# Patient Record
Sex: Female | Born: 1975 | Race: White | Hispanic: No | Marital: Married
Health system: Southern US, Community
[De-identification: ages and names within clinical notes are randomized; demographics above are authoritative.]

## PROBLEM LIST (undated history)

## (undated) DIAGNOSIS — E079 Disorder of thyroid, unspecified: Secondary | ICD-10-CM

## (undated) DIAGNOSIS — I10 Essential (primary) hypertension: Secondary | ICD-10-CM

## (undated) DIAGNOSIS — F419 Anxiety disorder, unspecified: Secondary | ICD-10-CM

## (undated) HISTORY — DX: Anxiety disorder, unspecified: F41.9

## (undated) HISTORY — DX: Essential (primary) hypertension: I10

## (undated) HISTORY — PX: TONSILLECTOMY: SUR1361

## (undated) HISTORY — PX: LEG SURGERY: SHX1003

## (undated) HISTORY — DX: Disorder of thyroid, unspecified: E07.9

---

## 2000-03-19 ENCOUNTER — Other Ambulatory Visit: Admission: RE | Admit: 2000-03-19 | Discharge: 2000-03-19 | Payer: Self-pay | Admitting: Obstetrics and Gynecology

## 2000-10-19 ENCOUNTER — Inpatient Hospital Stay (HOSPITAL_COMMUNITY): Admission: AD | Admit: 2000-10-19 | Discharge: 2000-10-22 | Payer: Self-pay | Admitting: Obstetrics and Gynecology

## 2001-03-18 ENCOUNTER — Other Ambulatory Visit: Admission: RE | Admit: 2001-03-18 | Discharge: 2001-03-18 | Payer: Self-pay | Admitting: Obstetrics & Gynecology

## 2002-05-18 ENCOUNTER — Other Ambulatory Visit: Admission: RE | Admit: 2002-05-18 | Discharge: 2002-05-18 | Payer: Self-pay | Admitting: Obstetrics & Gynecology

## 2003-05-22 ENCOUNTER — Other Ambulatory Visit: Admission: RE | Admit: 2003-05-22 | Discharge: 2003-05-22 | Payer: Self-pay | Admitting: Obstetrics & Gynecology

## 2004-05-22 ENCOUNTER — Other Ambulatory Visit: Admission: RE | Admit: 2004-05-22 | Discharge: 2004-05-22 | Payer: Self-pay | Admitting: Obstetrics & Gynecology

## 2006-04-16 ENCOUNTER — Inpatient Hospital Stay (HOSPITAL_COMMUNITY): Admission: RE | Admit: 2006-04-16 | Discharge: 2006-04-18 | Payer: Self-pay | Admitting: Obstetrics & Gynecology

## 2017-05-20 ENCOUNTER — Other Ambulatory Visit: Payer: Self-pay | Admitting: Obstetrics & Gynecology

## 2017-05-20 DIAGNOSIS — R928 Other abnormal and inconclusive findings on diagnostic imaging of breast: Secondary | ICD-10-CM

## 2017-05-27 ENCOUNTER — Ambulatory Visit: Payer: Self-pay

## 2017-05-27 ENCOUNTER — Ambulatory Visit
Admission: RE | Admit: 2017-05-27 | Discharge: 2017-05-27 | Disposition: A | Discharge status: Home / Self Care | Payer: BLUE CROSS/BLUE SHIELD | Source: Ambulatory Visit | Attending: Obstetrics & Gynecology | Admitting: Obstetrics & Gynecology

## 2017-05-27 DIAGNOSIS — R928 Other abnormal and inconclusive findings on diagnostic imaging of breast: Secondary | ICD-10-CM

## 2018-12-28 IMAGING — MG DIGITAL DIAGNOSTIC UNILATERAL LEFT MAMMOGRAM WITH TOMO AND CAD
4 series · 4 of 12 positions shown · non-contrast
Comparison: Previous exam(s).

CLINICAL DATA: 42-year-old patient recalled from recent 2D
screening mammogram for evaluation of a possible left breast
asymmetry seen superiorly in the MLO projection.

EXAM:
DIGITAL DIAGNOSTIC UNILATERAL LEFT MAMMOGRAM WITH CAD AND TOMO

[L MLO synth-2D]
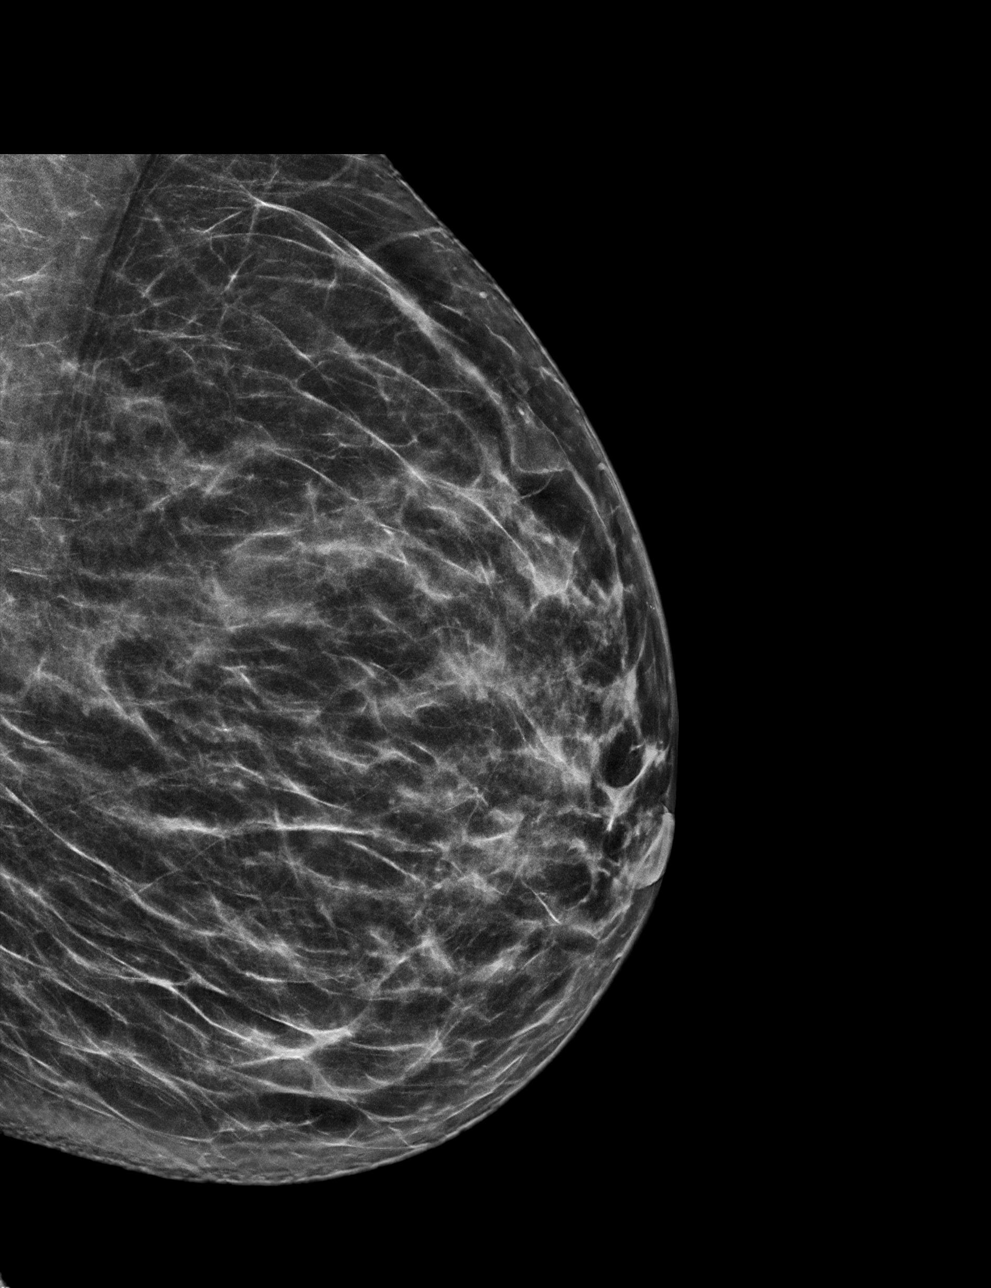

[L CC synth-2D]
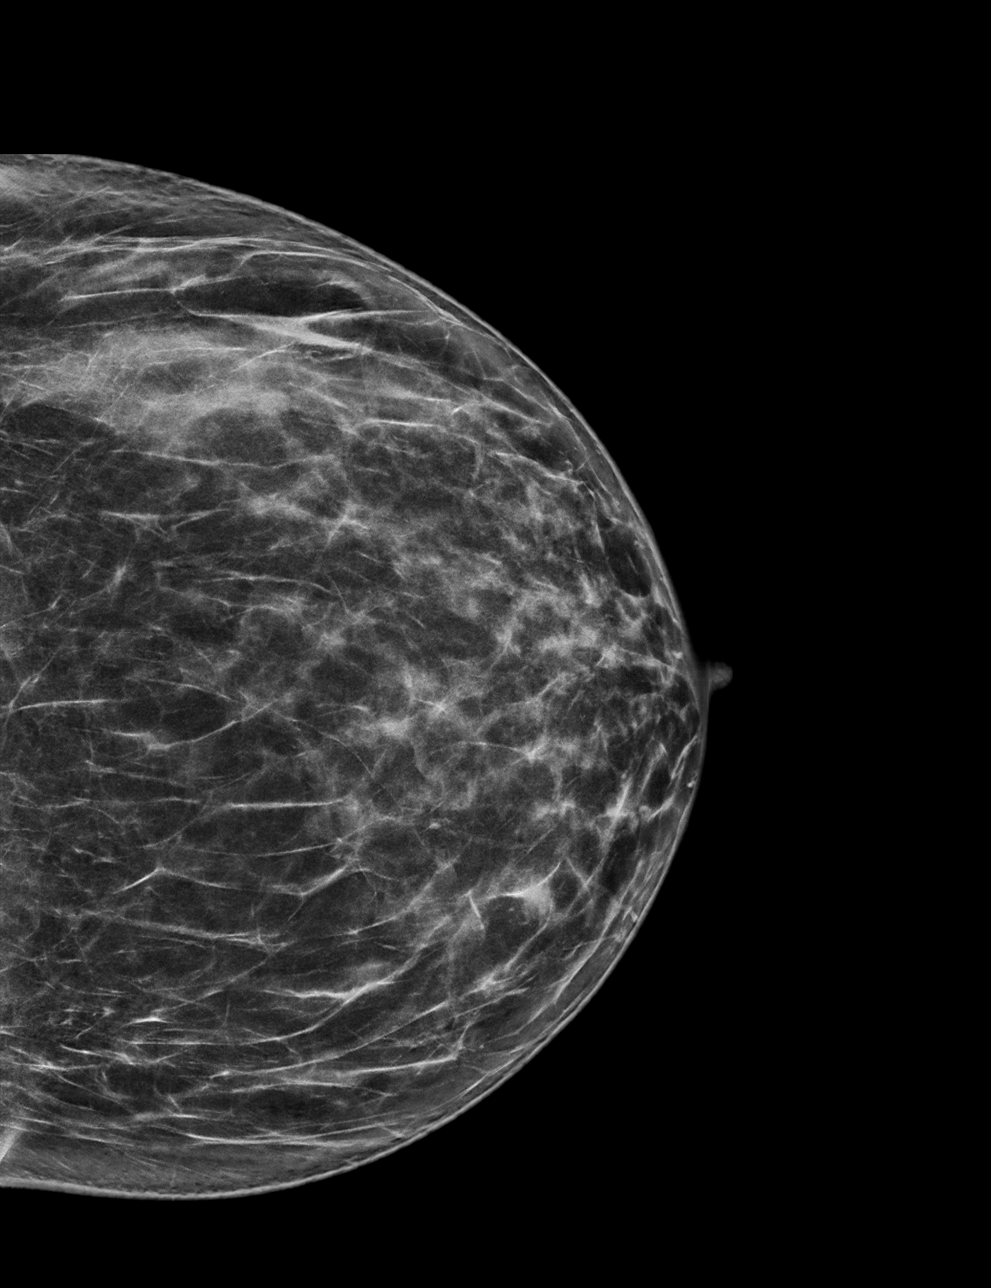

[L CC tomo · tomo slice 33/65.0]
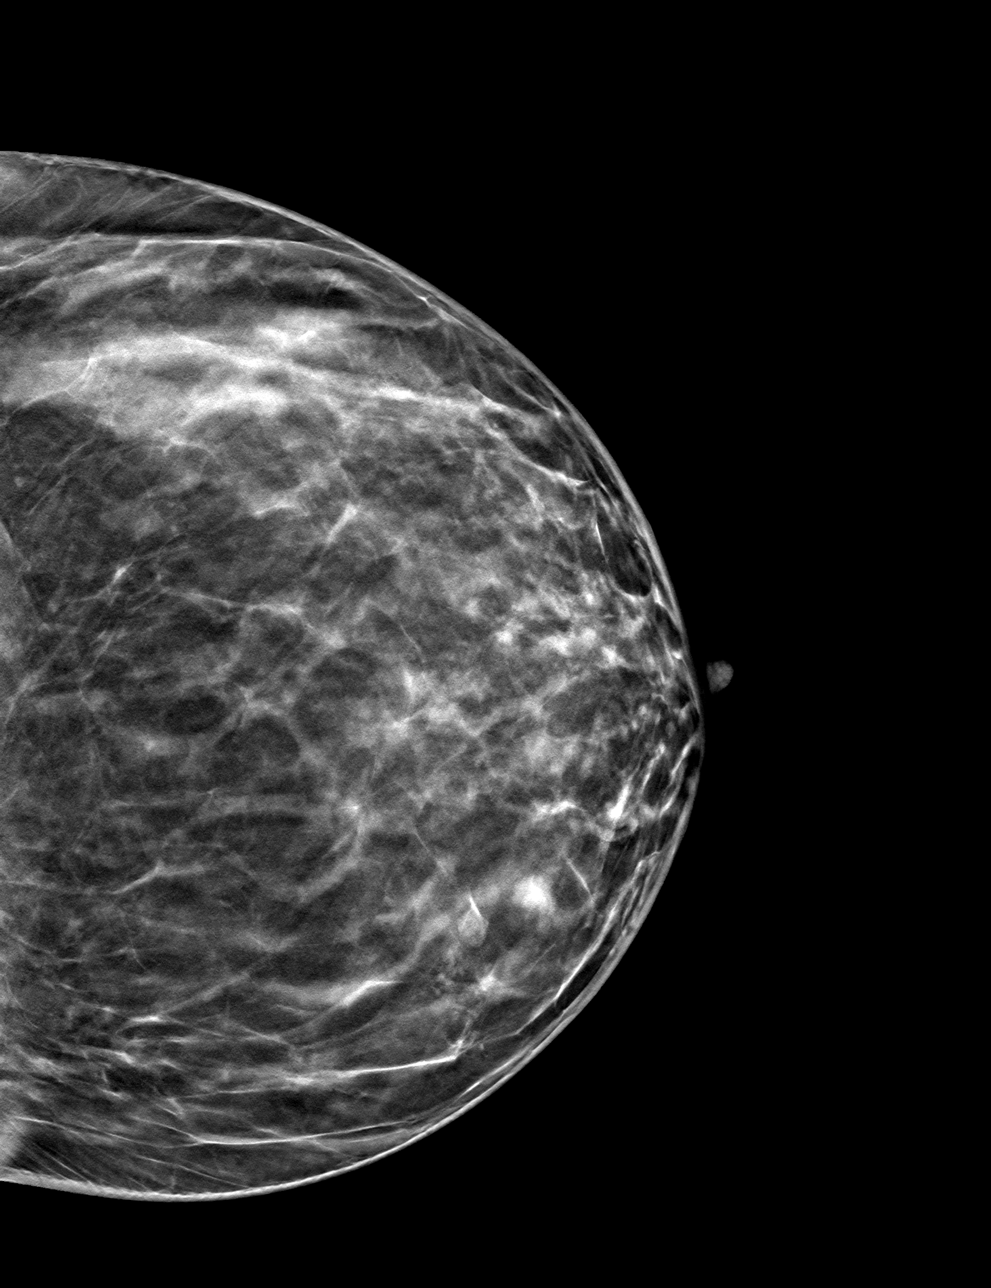

[L MLO tomo · tomo slice 30/59.0]
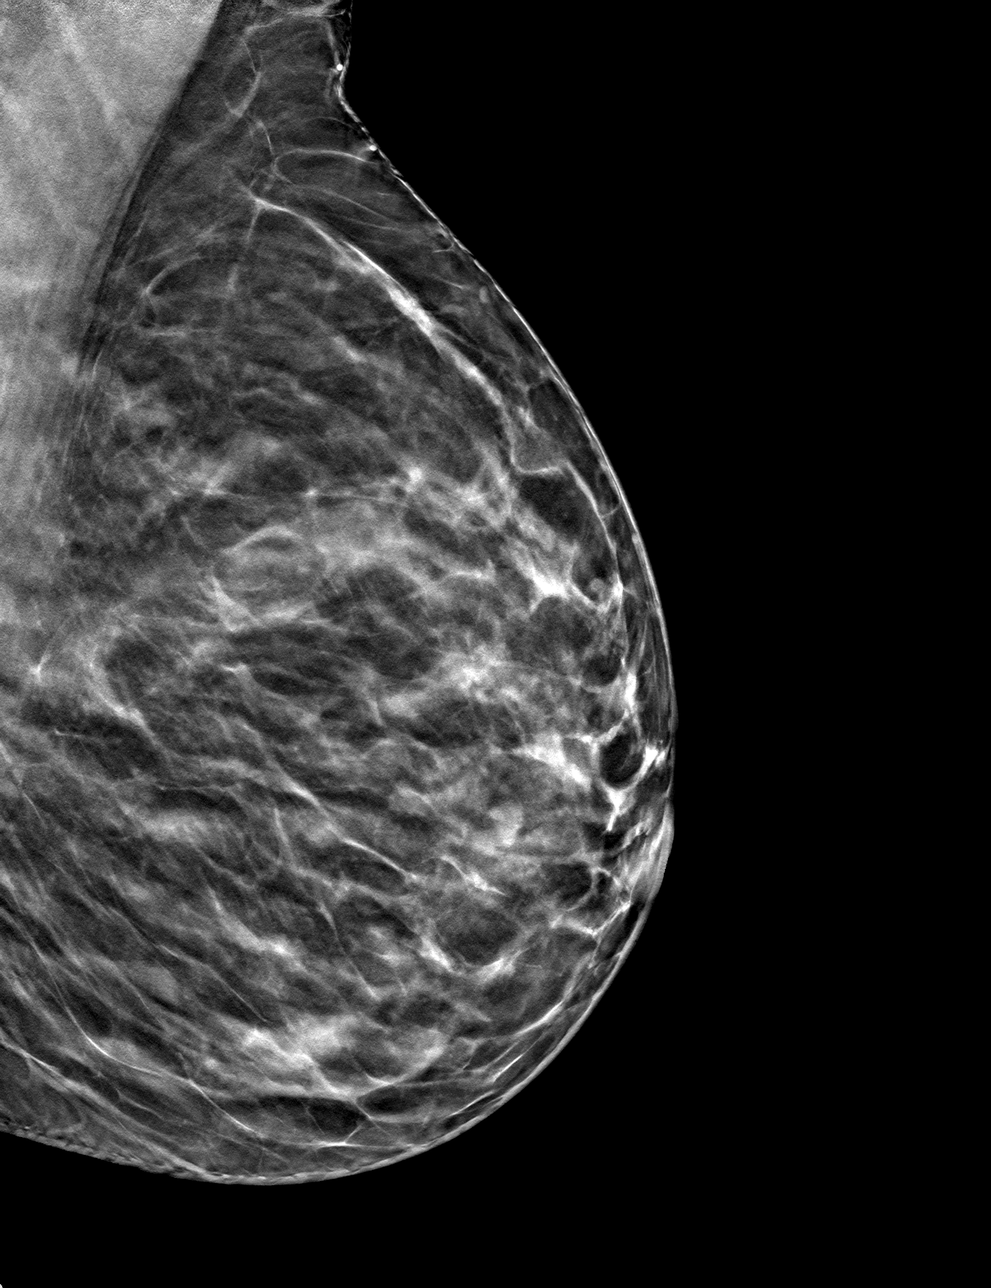

[4 of 12 positions shown; findings below may reference images not displayed]

ACR Breast Density Category c: The breast tissue is heterogeneously
dense, which may obscure small masses.
FINDINGS: Whole breast MLO and CC views including tomography show normal
fibroglandular tissue in the superior left breast. The possible
asymmetry identified on the screening mammogram is shown to be due
to overlap of fibroglandular densities. No mass or architectural
distortion is identified in the left breast.

Mammographic images were processed with CAD.
IMPRESSION: No evidence of malignancy in the left breast.

RECOMMENDATION:
Screening mammogram in one year.(Code:[id number])

I have discussed the findings and recommendations with the patient.
Results were also provided in writing at the conclusion of the
visit. If applicable, a reminder letter will be sent to the patient
regarding the next appointment.

BI-RADS CATEGORY  1: Negative.

## 2019-05-08 ENCOUNTER — Ambulatory Visit: Payer: Self-pay | Attending: Internal Medicine

## 2019-05-08 DIAGNOSIS — Z23 Encounter for immunization: Secondary | ICD-10-CM | POA: Insufficient documentation

## 2019-05-08 NOTE — Progress Notes (Signed)
   Covid-19 Vaccination Clinic  Name:  Maria Ewing    MRN: [id number] DOB: 1975-08-29  05/08/2019  Maria Ewing was observed post Covid-19 immunization for 15 minutes without incident. She was provided with Vaccine Information Sheet and instruction to access the V-Safe system.   Maria Ewing was instructed to call 911 with any severe reactions post vaccine: Marland Kitchen Difficulty breathing  . Swelling of face and throat  . A fast heartbeat  . A bad rash all over body  . Dizziness and weakness   Immunizations Administered    Name Date Dose VIS Date Route   Pfizer COVID-19 Vaccine 05/08/2019  5:17 PM 0.3 mL 02/11/2019 Intramuscular   Manufacturer: [employer], [employer]   Lot: [id number]   NDC: [address]-3

## 2019-06-07 ENCOUNTER — Ambulatory Visit: Payer: Self-pay | Attending: Family Medicine

## 2019-06-07 DIAGNOSIS — Z23 Encounter for immunization: Secondary | ICD-10-CM

## 2019-06-07 NOTE — Progress Notes (Signed)
   Covid-19 Vaccination Clinic  Name:  Maria Ewing    MRN: [id number] DOB: 14-Mar-1975  06/07/2019  Ms. Maria Ewing was observed post Covid-19 immunization for 15 minutes without incident. She was provided with Vaccine Information Sheet and instruction to access the V-Safe system.   Ms. Maria Ewing was instructed to call 911 with any severe reactions post vaccine: Marland Kitchen Difficulty breathing  . Swelling of face and throat  . A fast heartbeat  . A bad rash all over body  . Dizziness and weakness   Immunizations Administered    Name Date Dose VIS Date Route   Pfizer COVID-19 Vaccine 06/07/2019  2:47 PM 0.3 mL 02/11/2019 Intramuscular   Manufacturer: [employer], [employer]   Lot: [id number]   NDC: [address]-8

## 2019-11-29 ENCOUNTER — Ambulatory Visit: Payer: Self-pay | Admitting: Cardiology

## 2019-12-09 ENCOUNTER — Other Ambulatory Visit (HOSPITAL_COMMUNITY)
Admission: RE | Admit: 2019-12-09 | Discharge: 2019-12-09 | Disposition: A | Discharge status: Home / Self Care | Payer: BC Managed Care – PPO | Source: Ambulatory Visit | Attending: Cardiology | Admitting: Cardiology

## 2019-12-09 ENCOUNTER — Ambulatory Visit: Payer: BC Managed Care – PPO | Admitting: Cardiology

## 2019-12-09 ENCOUNTER — Encounter: Payer: Self-pay | Admitting: Cardiology

## 2019-12-09 ENCOUNTER — Other Ambulatory Visit: Payer: Self-pay

## 2019-12-09 VITALS — BP 148/89 | HR 87 | Resp 16 | Ht 68.0 in | Wt 163.0 lb

## 2019-12-09 DIAGNOSIS — I10 Essential (primary) hypertension: Secondary | ICD-10-CM

## 2019-12-09 DIAGNOSIS — Z01812 Encounter for preprocedural laboratory examination: Secondary | ICD-10-CM | POA: Diagnosis present

## 2019-12-09 DIAGNOSIS — Z20822 Contact with and (suspected) exposure to covid-19: Secondary | ICD-10-CM | POA: Insufficient documentation

## 2019-12-09 DIAGNOSIS — R072 Precordial pain: Secondary | ICD-10-CM

## 2019-12-09 DIAGNOSIS — I493 Ventricular premature depolarization: Secondary | ICD-10-CM

## 2019-12-09 LAB — SARS CORONAVIRUS 2 (TAT 6-24 HRS): SARS Coronavirus 2: NEGATIVE

## 2019-12-09 NOTE — Progress Notes (Signed)
Date:  12/09/2019   ID:  Maria Ewing, DOB 12-29-1975, MRN [id number]  PCP:  Chesley Noon, MD  Cardiologist:  Rex Kras, DO, [facility] (established care 12/09/2019) Former Cardiology Providers: Nolene Ebbs, MD  REASON FOR CONSULT: Chest Pain and Tachycardia   REQUESTING PHYSICIAN:  Chesley Noon, MD [place],  [place] [address]  Chief Complaint  Patient presents with  . Tachycardia  . New Patient (Initial Visit)  . Chest Pain    HPI  Maria Ewing is a 44 y.o. female who presents to the office with a chief complaint of " chest pain." Patient's past medical history and cardiovascular risk factors include: Hypertension, hypothyroidism.  She is referred to the office at the request of Chesley Noon, MD for evaluation of chest pain and tachycardia..  Chest pain: Patient states that she been having symptoms of chest discomfort since September 2020.  She has already seen a cardiologist at [facility] who recommended an echocardiogram and improvement of her modifiable cardiovascular risk factors.  Due to continued symptoms she presents for reevaluation.  Patient states that the intensity and frequency of chest discomfort remains the same since September 2020.  The pain is located over the left anterior chest wall, it lasts for about 10 minutes.  At times it is brought on by effort related activities and can resolve with rest as well.  She has not used sublingual nitroglycerin tablets for her discomfort.  She is overall euvolemic and not in congestive heart failure.  She had an echocardiogram done at an outside facility I do not have the images to review but the report was reviewed findings noted below.  Patient has not had a stress test or any additional cardiovascular testing since then.  In regards to tachycardia patient states that her symptoms are overall stable.  Her heart rate is well controlled at today's office visit.  Patient's blood  pressures are now well controlled at today's visit.  She is contributing this to underlying stress and not taking her morning medications.  I have asked her to keep a log of her blood pressures and to bring it in at the next visit.  No family history of premature coronary disease or sudden cardiac death.  FUNCTIONAL STATUS: Camping activities twice a week and walks about 3 miles a day at work.    ALLERGIES: Allergies  Allergen Reactions  . Oxycodone Other (See Comments)    " I lose time."  . Amoxicillin Rash    MEDICATION LIST PRIOR TO VISIT: Current Meds  Medication Sig  . albuterol (PROVENTIL) (2.5 MG/3ML) 0.083% nebulizer solution as needed.  Marland Kitchen albuterol (VENTOLIN HFA) 108 (90 Base) MCG/ACT inhaler Inhale into the lungs as needed.  Marland Kitchen amLODipine (NORVASC) 5 MG tablet Take 5 mg by mouth daily.  . chlorthalidone (HYGROTON) 25 MG tablet Take 12.5 mg by mouth daily.  . ferrous sulfate 325 (65 FE) MG tablet Take 1 tablet by mouth daily.  . fexofenadine (ALLEGRA) 60 MG tablet Take 1 tablet by mouth as needed.  . hydrOXYzine (ATARAX/VISTARIL) 25 MG tablet Take by mouth as needed.  Marland Kitchen levothyroxine (SYNTHROID) 100 MCG tablet Take 1 tablet by mouth daily.     PAST MEDICAL HISTORY: Past Medical History:  Diagnosis Date  . Anxiety   . Hypertension   . Thyroid disease     PAST SURGICAL HISTORY: Past Surgical History:  Procedure Laterality Date  . CESAREAN SECTION    . LEG SURGERY    .  TONSILLECTOMY      FAMILY HISTORY: The patient family history includes Breast cancer in her maternal grandmother; COPD in her father; Hypertension in her mother.  SOCIAL HISTORY:  The patient  reports that she is a non-smoker but has been exposed to tobacco smoke. She has never used smokeless tobacco. She reports previous alcohol use. She reports that she does not use drugs.  REVIEW OF SYSTEMS: Review of Systems  Constitutional: Negative for chills and fever.  HENT: Negative for hoarse voice  and nosebleeds.   Eyes: Negative for discharge, double vision and pain.  Cardiovascular: Positive for chest pain. Negative for claudication, dyspnea on exertion, leg swelling, near-syncope, orthopnea, palpitations, paroxysmal nocturnal dyspnea and syncope.  Respiratory: Negative for hemoptysis and shortness of breath.   Musculoskeletal: Negative for muscle cramps and myalgias.  Gastrointestinal: Negative for abdominal pain, constipation, diarrhea, hematemesis, hematochezia, melena, nausea and vomiting.  Neurological: Negative for dizziness and light-headedness.    PHYSICAL EXAM: Vitals with BMI 12/09/2019  Height '5\' 8"'   Weight 163 lbs  BMI 62.70  Systolic 350  Diastolic 89  Pulse 87   CONSTITUTIONAL: Well-developed and well-nourished. No acute distress.  SKIN: Skin is warm and dry. No rash noted. No cyanosis. No pallor. No jaundice HEAD: Normocephalic and atraumatic.  EYES: No scleral icterus MOUTH/THROAT: Moist oral membranes.  NECK: No JVD present. No thyromegaly noted. No carotid bruits  LYMPHATIC: No visible cervical adenopathy.  CHEST Normal respiratory effort. No intercostal retractions  LUNGS: Clear to auscultation bilaterally.  No stridor. No wheezes. No rales.  CARDIOVASCULAR: Regular rate and rhythm, positive S1-S2, no murmurs rubs or gallops appreciated. ABDOMINAL: No apparent ascites.  EXTREMITIES: No peripheral edema  HEMATOLOGIC: No significant bruising NEUROLOGIC: Oriented to person, place, and time. Nonfocal. Normal muscle tone.  PSYCHIATRIC: Normal mood and affect. Normal behavior. Cooperative  CARDIAC DATABASE: EKG: 12/09/2019: Normal sinus rhythm, 88 bpm, normal axis, occasional PVCs, without underlying injury pattern.  Echocardiogram: 02/2019 at [facility]: Left Ventricle Normal left ventricular structure and size. Wall thickness is normal. Systolic function is normal with an ejection fraction of 55-65%. Wall motion is within normal limits. There  is no diastolic dysfunction and normal left atrial pressure.   Stress Testing: No results found for this or any previous visit from the past 1095 days.  Heart Catheterization: None  LABORATORY DATA: External Labs: Collected:  11/14/2019 Care Everywhere Hb: 11.33m/dL Creatinine 0.83 mg/dL. eGFR: 85 mL/min per 1.73 m Lipid profile: Total cholesterol 189, triglycerides 125, HDL 37, LDL 129 TSH: 2.22  IMPRESSION:    ICD-10-CM   1. Precordial chest pain  R07.2 EKG 12-Lead    CT CARDIAC SCORING    PCV CARDIAC STRESS TEST    Pregnancy, urine    SARS-COV-2 RNA,(COVID-19) QUAL NAAT  2. Essential hypertension, benign  I10      RECOMMENDATIONS: Maria Ewing a 44y.o. female whose past medical history and cardiac risk factors include: hypertension, hypothyroidism.   Precordial chest pain: EKG shows normal sinus rhythm without underlying injury pattern. Her symptoms of chest discomfort have both typical and atypical features.  Because they have been longstanding we discussed additional cardiovascular testing for further evaluation and management.  We discussed undergoing either coronary CTA or exercise treadmill stress test with coronary calcification scoring.  Patient would like to proceed with an exercise treadmill stress test and calcium scoring for further risk stratification. Continue current medications. Patient is asked to keep a log of her blood pressures and to bring it  in at the next visit. She is asked to seek medical attention if her symptoms increase in intensity, frequency, duration or has typical chest pain.  She verbalizes understanding and provides verbal feedback. Patient will need to get Covid screening prior to the GXT.  Patient will also need a urine pregnancy screen prior to CT. Echocardiogram results reviewed from care everywhere and noted above for further reference Independently reviewed labs performed at [facility] as well. Estimated 10-year risk of ASCVD  approximately 1.8%.  Benign essential hypertension: Continue current medications.  Currently managed by primary care provider.  Patient had occasional PVCs on the EKG.  We will continue to monitor for now.   FINAL MEDICATION LIST END OF ENCOUNTER: No orders of the defined types were placed in this encounter.   There are no discontinued medications.   Current Outpatient Medications:  .  albuterol (PROVENTIL) (2.5 MG/3ML) 0.083% nebulizer solution, as needed., Disp: , Rfl:  .  albuterol (VENTOLIN HFA) 108 (90 Base) MCG/ACT inhaler, Inhale into the lungs as needed., Disp: , Rfl:  .  amLODipine (NORVASC) 5 MG tablet, Take 5 mg by mouth daily., Disp: , Rfl:  .  chlorthalidone (HYGROTON) 25 MG tablet, Take 12.5 mg by mouth daily., Disp: , Rfl:  .  ferrous sulfate 325 (65 FE) MG tablet, Take 1 tablet by mouth daily., Disp: , Rfl:  .  fexofenadine (ALLEGRA) 60 MG tablet, Take 1 tablet by mouth as needed., Disp: , Rfl:  .  hydrOXYzine (ATARAX/VISTARIL) 25 MG tablet, Take by mouth as needed., Disp: , Rfl:  .  levothyroxine (SYNTHROID) 100 MCG tablet, Take 1 tablet by mouth daily., Disp: , Rfl:   Orders Placed This Encounter  Procedures  . SARS-COV-2 RNA,(COVID-19) QUAL NAAT  . CT CARDIAC SCORING  . Pregnancy, urine  . PCV CARDIAC STRESS TEST  . EKG 12-Lead    There are no Patient Instructions on file for this visit.   --Continue cardiac medications as reconciled in final medication list. --Return in about 4 weeks (around 01/06/2020) for Reevaluation of chest paina and review test results. . Or sooner if needed. --Continue follow-up with your primary care physician regarding the management of your other chronic comorbid conditions.  Patient's questions and concerns were addressed to her satisfaction. She voices understanding of the instructions provided during this encounter.   This note was created using a voice recognition software as a result there may be grammatical errors  inadvertently enclosed that do not reflect the nature of this encounter. Every attempt is made to correct such errors.  Rex Kras, [place], [facility]  Pager: [phone] Office: [phone]

## 2019-12-10 LAB — PREGNANCY, URINE: Preg Test, Ur: NEGATIVE

## 2019-12-12 ENCOUNTER — Ambulatory Visit: Payer: BC Managed Care – PPO

## 2019-12-12 ENCOUNTER — Other Ambulatory Visit: Payer: Self-pay

## 2019-12-12 DIAGNOSIS — R072 Precordial pain: Secondary | ICD-10-CM

## 2019-12-13 ENCOUNTER — Other Ambulatory Visit: Payer: Self-pay

## 2019-12-13 DIAGNOSIS — R072 Precordial pain: Secondary | ICD-10-CM

## 2019-12-21 ENCOUNTER — Ambulatory Visit: Payer: BC Managed Care – PPO | Admitting: Cardiology

## 2019-12-21 ENCOUNTER — Other Ambulatory Visit: Payer: Self-pay

## 2019-12-21 VITALS — BP 134/86 | HR 93 | Ht 68.0 in | Wt 163.0 lb

## 2019-12-21 DIAGNOSIS — I2584 Coronary atherosclerosis due to calcified coronary lesion: Secondary | ICD-10-CM

## 2019-12-21 DIAGNOSIS — R9439 Abnormal result of other cardiovascular function study: Secondary | ICD-10-CM

## 2019-12-21 DIAGNOSIS — I493 Ventricular premature depolarization: Secondary | ICD-10-CM

## 2019-12-21 DIAGNOSIS — I10 Essential (primary) hypertension: Secondary | ICD-10-CM

## 2019-12-21 DIAGNOSIS — R072 Precordial pain: Secondary | ICD-10-CM

## 2019-12-21 DIAGNOSIS — I2510 Atherosclerotic heart disease of native coronary artery without angina pectoris: Secondary | ICD-10-CM

## 2019-12-21 MED ORDER — PRAVASTATIN SODIUM 40 MG PO TABS: 40.0000 mg | ORAL_TABLET | Freq: Every evening | ORAL | 0 refills | Status: AC

## 2019-12-21 MED ORDER — ASPIRIN EC 81 MG PO TBEC: 81.0000 mg | DELAYED_RELEASE_TABLET | Freq: Every day | ORAL | 3 refills | Status: AC

## 2019-12-21 MED ORDER — METOPROLOL TARTRATE 50 MG PO TABS: 50.0000 mg | ORAL_TABLET | Freq: Two times a day (BID) | ORAL | 0 refills | Status: AC

## 2019-12-21 NOTE — Progress Notes (Signed)
Date:  12/21/2019   ID:  [name], [name] 1975-12-18, MRN [id number]  PCP:  Chesley Noon, MD  Cardiologist:  Rex Kras, DO, [facility] (established care 12/09/2019) Former Cardiology Providers: Nolene Ebbs, MD  Date: 12/21/19 Last Office Visit: 12/09/2019  Chief Complaint  Patient presents with  . Chest Pain  . Follow-up  . Results    HPI  Maria Ewing Ewing is a 44 y.o. female who presents to the office with a chief complaint of " re-evaluation of chest pain and review test results." Patient's past medical history and cardiovascular risk factors include: Coronary artery calcification, hyperlipidemia, hypertension, hypothyroidism.  She is referred to the office at the request of Chesley Noon, MD for evaluation of chest pain and tachycardia..  Patient was referred to the office for evaluation of chest pain.  At the last office visit her precordial chest pain had both typical and atypical features.  In this shared decision was to proceed with GXT and coronary calcification scoring.  Patient's GXT was reported to be equivocal for exercise-induced ischemia and CT calcium scoring noted total calcium score of 65 consistent with mild coronary artery calcification.    With regards to symptoms patient states that her symptoms have improved in intensity but still present.  She continues to have left precordial chest pain that gets worse with activity and improves with rest.  She also notes intermittent jaw pain and left arm pain since last visit.    FUNCTIONAL STATUS: Camping activities twice a week and walks about 3 miles a day at work.    ALLERGIES: Allergies  Allergen Reactions  . Oxycodone Other (See Comments)    " I lose time."  . Amoxicillin Rash    MEDICATION LIST PRIOR TO VISIT: Current Meds  Medication Sig  . albuterol (PROVENTIL) (2.5 MG/3ML) 0.083% nebulizer solution as needed.  Marland Kitchen albuterol (VENTOLIN HFA) 108 (90 Base) MCG/ACT inhaler Inhale  into the lungs as needed.  Marland Kitchen amLODipine (NORVASC) 5 MG tablet Take 5 mg by mouth daily.  . chlorthalidone (HYGROTON) 25 MG tablet Take 12.5 mg by mouth daily.  . ferrous sulfate 325 (65 FE) MG tablet Take 1 tablet by mouth daily.  . fexofenadine (ALLEGRA) 60 MG tablet Take 1 tablet by mouth as needed.  . hydrOXYzine (ATARAX/VISTARIL) 25 MG tablet Take by mouth as needed.  Marland Kitchen levothyroxine (SYNTHROID) 100 MCG tablet Take 1 tablet by mouth daily.     PAST MEDICAL HISTORY: Past Medical History:  Diagnosis Date  . Anxiety   . Hypertension   . Thyroid disease     PAST SURGICAL HISTORY: Past Surgical History:  Procedure Laterality Date  . CESAREAN SECTION    . LEG SURGERY    . TONSILLECTOMY      FAMILY HISTORY: The patient family history includes Breast cancer in her maternal grandmother; COPD in her father; Hypertension in her mother.  SOCIAL HISTORY:  The patient  reports that she is a non-smoker but has been exposed to tobacco smoke. She has never used smokeless tobacco. She reports previous alcohol use. She reports that she does not use drugs.  REVIEW OF SYSTEMS: Review of Systems  Constitutional: Negative for chills and fever.  HENT: Negative for hoarse voice and nosebleeds.   Eyes: Negative for discharge, double vision and pain.  Cardiovascular: Positive for chest pain. Negative for claudication, dyspnea on exertion, leg swelling, near-syncope, orthopnea, palpitations, paroxysmal nocturnal dyspnea and syncope.  Respiratory: Negative for hemoptysis and shortness of breath.  Musculoskeletal: Negative for muscle cramps and myalgias.  Gastrointestinal: Negative for abdominal pain, constipation, diarrhea, hematemesis, hematochezia, melena, nausea and vomiting.  Neurological: Negative for dizziness and light-headedness.    PHYSICAL EXAM: Vitals with BMI 12/21/2019 12/09/2019  Height _0  _1   Weight 163 lbs 163 lbs  BMI 88.82 80.03  Systolic 491 791  Diastolic 86 89    Pulse 93 87   CONSTITUTIONAL: Well-developed and well-nourished. No acute distress.  SKIN: Skin is warm and dry. No rash noted. No cyanosis. No pallor. No jaundice HEAD: Normocephalic and atraumatic.  EYES: No scleral icterus MOUTH/THROAT: Moist oral membranes.  NECK: No JVD present. No thyromegaly noted. No carotid bruits  LYMPHATIC: No visible cervical adenopathy.  CHEST Normal respiratory effort. No intercostal retractions  LUNGS: Clear to auscultation bilaterally.  No stridor. No wheezes. No rales.  CARDIOVASCULAR: Regular rate and rhythm, positive S1-S2, no murmurs rubs or gallops appreciated. ABDOMINAL: No apparent ascites.  EXTREMITIES: No peripheral edema  HEMATOLOGIC: No significant bruising NEUROLOGIC: Oriented to person, place, and time. Nonfocal. Normal muscle tone.  PSYCHIATRIC: Normal mood and affect. Normal behavior. Cooperative  CARDIAC DATABASE: EKG: 12/09/2019: Normal sinus rhythm, 88 bpm, normal axis, occasional PVCs, without underlying injury pattern.  Echocardiogram: 02/2019 at [facility]: Left Ventricle Normal left ventricular structure and size. Wall thickness is normal. Systolic function is normal with an ejection fraction of 55-65%. Wall motion is within normal limits. There is no diastolic dysfunction and normal left atrial pressure.   Stress Testing: Exercise treadmill stress test 12/12/2019: Exercise treadmill stress test performed using Bruce protocol.  Patient reached 10.1 METS, and 101% of age predicted maximum heart rate.  Exercise capacity was normal.  6/10 non-limiting chest pain reported.  Normal heart rate and hemodynamic response.  Stress EKG showed sinus tachycardia, <106m upsloping ST depression leads II, III, aVF, 1.5 mm upsloping ST depressions in leads V4-V6, partially normalize two min into recovery. These changes are equivocal for ischemia. Equivocal exercise treadmill stress test. Recommend clinical correlation.   Coronary artery  calcification scoring performed on 12/14/2019 at [facility]  Left main: 0 Left anterior descending: 40 Left circumflex: 25 Right coronary artery: 0 Total calcium score: 65 AU, placing the patient at the 90-100th percentile for females between ages 47and 412  Heart Catheterization: None  LABORATORY DATA: External Labs: Collected:  11/14/2019 Care Everywhere Hb: 11.856mdL Creatinine 0.83 mg/dL. eGFR: 85 mL/min per 1.73 m Lipid profile: Total cholesterol 189, triglycerides 125, HDL 37, LDL 129 TSH: 2.22  IMPRESSION:    ICD-10-CM   1. Precordial chest pain  R07.2 CT CORONARY MORPH W/CTA COR W/SCORE W/CA W/CM &/OR WO/CM    CT CORONARY FRACTIONAL FLOW RESERVE DATA PREP    CT CORONARY FRACTIONAL FLOW RESERVE FLUID ANALYSIS    CMP14+EGFR  2. Coronary atherosclerosis due to calcified coronary lesion of native artery  I25.10 CT CORONARY MORPH W/CTA COR W/SCORE W/CA W/CM &/OR WO/CM   I25.84 CT CORONARY FRACTIONAL FLOW RESERVE DATA PREP    CT CORONARY FRACTIONAL FLOW RESERVE FLUID ANALYSIS    metoprolol tartrate (LOPRESSOR) 50 MG tablet    aspirin EC 81 MG tablet    pravastatin (PRAVACHOL) 40 MG tablet  3. Equivocal stress test  R94.39 CT CORONARY MORPH W/CTA COR W/SCORE W/CA W/CM &/OR WO/CM    CT CORONARY FRACTIONAL FLOW RESERVE DATA PREP    CT CORONARY FRACTIONAL FLOW RESERVE FLUID ANALYSIS  4. Essential hypertension, benign  I10   5. Premature ventricular contractions  I49.3  RECOMMENDATIONS: Maria Ewing Ewing is a 44 y.o. female whose past medical history and cardiac risk factors include: Coronary artery calcification, hyperlipidemia, hypertension, hypothyroidism.  Precordial chest pain:  Patient continues to have precordial chest pain with both typical and atypical features and associated symptoms of jaw pain and arm pain.  Patient's work-up thus far has included a GXT and CT calcium scoring.  The GXT was reported to be equivocal for exercise-induced ischemia and  patient has mild coronary calcification which places her at the 90th first 100th percentile for age matched cohorts.  Shared decision is to proceed with coronary CTA to evaluate for obstructive CAD.  Start metoprolol tartrate 50 mg p.o. twice daily.  Coronary artery calcification:  Initiate aspirin 81 mg p.o. daily.  Initiate pravastatin 40 mg p.o. nightly.  We will check a CMP to evaluate for baseline AST and ALT.  Echocardiogram results reviewed from care everywhere and noted above for further reference  Benign essential hypertension: Continue current medications.  Currently managed by primary care provider.  Premature ventricular contractions: May consider reevaluation after initiation of beta-blocker therapy.  Continue to monitor  FINAL MEDICATION LIST END OF ENCOUNTER: Meds ordered this encounter  Medications  . metoprolol tartrate (LOPRESSOR) 50 MG tablet    Sig: Take 1 tablet (50 mg total) by mouth 2 (two) times daily.    Dispense:  60 tablet    Refill:  0  . aspirin EC 81 MG tablet    Sig: Take 1 tablet (81 mg total) by mouth daily. Swallow whole.    Dispense:  90 tablet    Refill:  3  . pravastatin (PRAVACHOL) 40 MG tablet    Sig: Take 1 tablet (40 mg total) by mouth every evening.    Dispense:  90 tablet    Refill:  0    There are no discontinued medications.   Current Outpatient Medications:  .  albuterol (PROVENTIL) (2.5 MG/3ML) 0.083% nebulizer solution, as needed., Disp: , Rfl:  .  albuterol (VENTOLIN HFA) 108 (90 Base) MCG/ACT inhaler, Inhale into the lungs as needed., Disp: , Rfl:  .  amLODipine (NORVASC) 5 MG tablet, Take 5 mg by mouth daily., Disp: , Rfl:  .  chlorthalidone (HYGROTON) 25 MG tablet, Take 12.5 mg by mouth daily., Disp: , Rfl:  .  ferrous sulfate 325 (65 FE) MG tablet, Take 1 tablet by mouth daily., Disp: , Rfl:  .  fexofenadine (ALLEGRA) 60 MG tablet, Take 1 tablet by mouth as needed., Disp: , Rfl:  .  hydrOXYzine (ATARAX/VISTARIL) 25 MG  tablet, Take by mouth as needed., Disp: , Rfl:  .  levothyroxine (SYNTHROID) 100 MCG tablet, Take 1 tablet by mouth daily., Disp: , Rfl:  .  aspirin EC 81 MG tablet, Take 1 tablet (81 mg total) by mouth daily. Swallow whole., Disp: 90 tablet, Rfl: 3 .  metoprolol tartrate (LOPRESSOR) 50 MG tablet, Take 1 tablet (50 mg total) by mouth 2 (two) times daily., Disp: 60 tablet, Rfl: 0 .  pravastatin (PRAVACHOL) 40 MG tablet, Take 1 tablet (40 mg total) by mouth every evening., Disp: 90 tablet, Rfl: 0  Orders Placed This Encounter  Procedures  . CT CORONARY MORPH W/CTA COR W/SCORE W/CA W/CM &/OR WO/CM  . CT CORONARY FRACTIONAL FLOW RESERVE DATA PREP  . CT CORONARY FRACTIONAL FLOW RESERVE FLUID ANALYSIS  . CMP14+EGFR    There are no Patient Instructions on file for this visit.   --Continue cardiac medications as reconciled in final medication list. --Return  in about 4 weeks (around 01/18/2020) for Review test results. Or sooner if needed. --Continue follow-up with your primary care physician regarding the management of your other chronic comorbid conditions.  Patient's questions and concerns were addressed to her satisfaction. She voices understanding of the instructions provided during this encounter.   This note was created using a voice recognition software as a result there may be grammatical errors inadvertently enclosed that do not reflect the nature of this encounter. Every attempt is made to correct such errors.  Rex Kras, [place], [facility]  Pager: [phone] Office: [phone]

## 2019-12-22 ENCOUNTER — Telehealth: Payer: Self-pay

## 2019-12-22 LAB — CMP14+EGFR
ALT: 56 IU/L — ABNORMAL HIGH (ref 0–32)
AST: 31 IU/L (ref 0–40)
Albumin/Globulin Ratio: 1.9 (ref 1.2–2.2)
Albumin: 4.5 g/dL (ref 3.8–4.8)
Alkaline Phosphatase: 197 IU/L — ABNORMAL HIGH (ref 44–121)
BUN/Creatinine Ratio: 12 (ref 9–23)
BUN: 10 mg/dL (ref 6–24)
Bilirubin Total: 0.5 mg/dL (ref 0.0–1.2)
CO2: 27 mmol/L (ref 20–29)
Calcium: 9.2 mg/dL (ref 8.7–10.2)
Chloride: 99 mmol/L (ref 96–106)
Creatinine, Ser: 0.84 mg/dL (ref 0.57–1.00)
GFR calc Af Amer: 97 mL/min/{1.73_m2} (ref >59)
GFR calc non Af Amer: 84 mL/min/{1.73_m2} (ref >59)
Globulin, Total: 2.4 g/dL (ref 1.5–4.5)
Glucose: 79 mg/dL (ref 65–99)
Potassium: 3.6 mmol/L (ref 3.5–5.2)
Sodium: 140 mmol/L (ref 134–144)
Total Protein: 6.9 g/dL (ref 6.0–8.5)

## 2019-12-22 NOTE — Telephone Encounter (Signed)
Called pt to inform her about her lab results. Pt understood

## 2019-12-22 NOTE — Telephone Encounter (Signed)
Thank you :)

## 2019-12-29 ENCOUNTER — Other Ambulatory Visit: Payer: Self-pay | Admitting: Cardiology

## 2019-12-29 DIAGNOSIS — R072 Precordial pain: Secondary | ICD-10-CM

## 2019-12-29 DIAGNOSIS — I2510 Atherosclerotic heart disease of native coronary artery without angina pectoris: Secondary | ICD-10-CM

## 2019-12-29 DIAGNOSIS — I2584 Coronary atherosclerosis due to calcified coronary lesion: Secondary | ICD-10-CM

## 2020-01-06 ENCOUNTER — Ambulatory Visit: Payer: BC Managed Care – PPO | Admitting: Cardiology

## 2022-05-13 ENCOUNTER — Other Ambulatory Visit: Payer: Self-pay | Admitting: Obstetrics & Gynecology

## 2022-05-13 DIAGNOSIS — R928 Other abnormal and inconclusive findings on diagnostic imaging of breast: Secondary | ICD-10-CM

## 2022-05-23 ENCOUNTER — Ambulatory Visit
Admission: RE | Admit: 2022-05-23 | Discharge: 2022-05-23 | Disposition: A | Discharge status: Home / Self Care | Payer: BC Managed Care – PPO | Source: Ambulatory Visit | Attending: Obstetrics & Gynecology | Admitting: Obstetrics & Gynecology

## 2022-05-23 DIAGNOSIS — R928 Other abnormal and inconclusive findings on diagnostic imaging of breast: Secondary | ICD-10-CM

## 2023-03-06 LAB — EXTERNAL GENERIC LAB PROCEDURE: COLOGUARD: NEGATIVE

## 2023-03-06 LAB — COLOGUARD: COLOGUARD: NEGATIVE

## 2023-07-09 ENCOUNTER — Other Ambulatory Visit: Payer: Self-pay | Admitting: Obstetrics & Gynecology

## 2023-07-09 DIAGNOSIS — R928 Other abnormal and inconclusive findings on diagnostic imaging of breast: Secondary | ICD-10-CM

## 2023-07-15 ENCOUNTER — Other Ambulatory Visit: Payer: Self-pay | Admitting: Obstetrics & Gynecology

## 2023-07-15 ENCOUNTER — Ambulatory Visit
Admission: RE | Admit: 2023-07-15 | Discharge: 2023-07-15 | Discharge status: Home / Self Care | Source: Ambulatory Visit | Attending: Obstetrics & Gynecology | Admitting: Obstetrics & Gynecology

## 2023-07-15 ENCOUNTER — Ambulatory Visit
Admission: RE | Admit: 2023-07-15 | Discharge: 2023-07-15 | Disposition: A | Discharge status: Home / Self Care | Source: Ambulatory Visit | Attending: Obstetrics & Gynecology | Admitting: Obstetrics & Gynecology

## 2023-07-15 DIAGNOSIS — R928 Other abnormal and inconclusive findings on diagnostic imaging of breast: Secondary | ICD-10-CM

## 2023-07-15 DIAGNOSIS — N6310 Unspecified lump in the right breast, unspecified quadrant: Secondary | ICD-10-CM

## 2023-07-23 ENCOUNTER — Other Ambulatory Visit: Payer: Self-pay | Admitting: Obstetrics & Gynecology

## 2023-07-23 DIAGNOSIS — R928 Other abnormal and inconclusive findings on diagnostic imaging of breast: Secondary | ICD-10-CM

## 2023-07-23 DIAGNOSIS — N6310 Unspecified lump in the right breast, unspecified quadrant: Secondary | ICD-10-CM

## 2023-07-28 ENCOUNTER — Ambulatory Visit
Admission: RE | Admit: 2023-07-28 | Discharge: 2023-07-28 | Disposition: A | Discharge status: Home / Self Care | Source: Ambulatory Visit | Attending: Obstetrics & Gynecology | Admitting: Obstetrics & Gynecology

## 2023-07-28 ENCOUNTER — Other Ambulatory Visit

## 2023-07-28 ENCOUNTER — Encounter

## 2023-07-28 DIAGNOSIS — R928 Other abnormal and inconclusive findings on diagnostic imaging of breast: Secondary | ICD-10-CM

## 2023-07-28 DIAGNOSIS — N6310 Unspecified lump in the right breast, unspecified quadrant: Secondary | ICD-10-CM
